# Patient Record
Sex: Male | Born: 1945 | Race: White | Hispanic: No | Marital: Married | Smoking: Never smoker
Health system: Southern US, Community
[De-identification: ages and names within clinical notes are randomized; demographics above are authoritative.]

## PROBLEM LIST (undated history)

## (undated) DIAGNOSIS — I10 Essential (primary) hypertension: Secondary | ICD-10-CM

## (undated) DIAGNOSIS — E7800 Pure hypercholesterolemia, unspecified: Secondary | ICD-10-CM

---

## 2013-09-16 ENCOUNTER — Ambulatory Visit: Payer: Self-pay | Admitting: Physician Assistant

## 2013-09-16 LAB — BASIC METABOLIC PANEL
Anion Gap: 10 (ref 7–16)
BUN: 19 mg/dL — ABNORMAL HIGH (ref 7–18)
CALCIUM: 10 mg/dL (ref 8.5–10.1)
CO2: 27 mmol/L (ref 21–32)
CREATININE: 1.29 mg/dL (ref 0.60–1.30)
Chloride: 104 mmol/L (ref 98–107)
EGFR (African American): >60
EGFR (Non-African Amer.): 57 — ABNORMAL LOW
GLUCOSE: 101 mg/dL — AB (ref 65–99)
Osmolality: 284 (ref 275–301)
Potassium: 4.5 mmol/L (ref 3.5–5.1)
SODIUM: 141 mmol/L (ref 136–145)

## 2013-09-16 LAB — CBC WITH DIFFERENTIAL/PLATELET
Basophil #: 0.1 10*3/uL (ref 0.0–0.1)
Basophil %: 0.7 %
Eosinophil #: 0.1 10*3/uL (ref 0.0–0.7)
Eosinophil %: 0.9 %
HCT: 44 % (ref 40.0–52.0)
HGB: 14.8 g/dL (ref 13.0–18.0)
LYMPHS PCT: 15.6 %
Lymphocyte #: 2.1 10*3/uL (ref 1.0–3.6)
MCH: 30.1 pg (ref 26.0–34.0)
MCHC: 33.6 g/dL (ref 32.0–36.0)
MCV: 90 fL (ref 80–100)
MONOS PCT: 13.8 %
Monocyte #: 1.9 x10 3/mm — ABNORMAL HIGH (ref 0.2–1.0)
NEUTROS ABS: 9.3 10*3/uL — AB (ref 1.4–6.5)
Neutrophil %: 69 %
PLATELETS: 236 10*3/uL (ref 150–440)
RBC: 4.91 10*6/uL (ref 4.40–5.90)
RDW: 14 % (ref 11.5–14.5)
WBC: 13.5 10*3/uL — AB (ref 3.8–10.6)

## 2013-09-16 LAB — URINALYSIS, COMPLETE
BACTERIA: NEGATIVE
Bilirubin,UR: NEGATIVE
Blood: NEGATIVE
Glucose,UR: NEGATIVE mg/dL (ref 0–75)
KETONE: NEGATIVE
Leukocyte Esterase: NEGATIVE
Nitrite: NEGATIVE
Ph: 5 (ref 4.5–8.0)
Protein: NEGATIVE
Specific Gravity: 1.02 (ref 1.003–1.030)
Squamous Epithelial: NONE SEEN

## 2014-12-18 ENCOUNTER — Other Ambulatory Visit: Payer: Self-pay | Admitting: Otolaryngology

## 2014-12-18 DIAGNOSIS — H903 Sensorineural hearing loss, bilateral: Secondary | ICD-10-CM

## 2014-12-18 DIAGNOSIS — H905 Unspecified sensorineural hearing loss: Secondary | ICD-10-CM

## 2014-12-26 ENCOUNTER — Ambulatory Visit
Admission: RE | Admit: 2014-12-26 | Discharge: 2014-12-26 | Disposition: A | Discharge status: Home / Self Care | Payer: Medicare Other | Source: Ambulatory Visit | Attending: Otolaryngology | Admitting: Otolaryngology

## 2014-12-26 DIAGNOSIS — H903 Sensorineural hearing loss, bilateral: Secondary | ICD-10-CM

## 2014-12-26 DIAGNOSIS — H905 Unspecified sensorineural hearing loss: Secondary | ICD-10-CM | POA: Diagnosis present

## 2014-12-26 LAB — CREATININE, SERUM
CREATININE: 0.96 mg/dL (ref 0.61–1.24)
GFR calc Af Amer: >60 mL/min (ref >60)
GFR calc non Af Amer: >60 mL/min (ref >60)

## 2014-12-26 MED ORDER — GADOBENATE DIMEGLUMINE 529 MG/ML IV SOLN
20.0000 mL | Freq: Once | INTRAVENOUS | Status: AC | PRN
  Administered 2014-12-26: 18 mL via INTRAVENOUS

## 2018-04-02 ENCOUNTER — Ambulatory Visit
Admission: EM | Admit: 2018-04-02 | Discharge: 2018-04-02 | Disposition: A | Discharge status: Home / Self Care | Payer: Medicare Other | Attending: Family Medicine | Admitting: Family Medicine

## 2018-04-02 ENCOUNTER — Encounter: Payer: Self-pay | Admitting: Emergency Medicine

## 2018-04-02 ENCOUNTER — Other Ambulatory Visit: Payer: Self-pay

## 2018-04-02 DIAGNOSIS — R51 Headache: Secondary | ICD-10-CM | POA: Diagnosis not present

## 2018-04-02 DIAGNOSIS — M25511 Pain in right shoulder: Secondary | ICD-10-CM | POA: Diagnosis not present

## 2018-04-02 DIAGNOSIS — M25561 Pain in right knee: Secondary | ICD-10-CM

## 2018-04-02 HISTORY — DX: Essential (primary) hypertension: I10

## 2018-04-02 HISTORY — DX: Pure hypercholesterolemia, unspecified: E78.00

## 2018-04-02 MED ORDER — NAPROXEN 500 MG PO TABS: 500.0000 mg | ORAL_TABLET | Freq: Two times a day (BID) | ORAL | 0 refills | Status: AC | PRN
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
## 2018-04-02 NOTE — Discharge Instructions (Signed)
Rest.  Heat and Ice.  Use the medication as needed for pain.  Take care  Dr. Adriana Simasook
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
## 2018-04-13 ENCOUNTER — Other Ambulatory Visit: Payer: Self-pay | Admitting: Family Medicine

## 2021-01-06 ENCOUNTER — Emergency Department: Payer: Medicare Other

## 2021-01-06 ENCOUNTER — Other Ambulatory Visit: Payer: Self-pay

## 2021-01-06 ENCOUNTER — Encounter: Payer: Self-pay | Admitting: Emergency Medicine

## 2021-01-06 ENCOUNTER — Ambulatory Visit
Admission: EM | Admit: 2021-01-06 | Discharge: 2021-01-06 | Disposition: A | Discharge status: Home / Self Care | Payer: Medicare Other | Attending: Sports Medicine | Admitting: Sports Medicine

## 2021-01-06 ENCOUNTER — Emergency Department
Admission: EM | Admit: 2021-01-06 | Discharge: 2021-01-06 | Disposition: A | Discharge status: Home / Self Care | Payer: Medicare Other | Attending: Emergency Medicine | Admitting: Emergency Medicine

## 2021-01-06 DIAGNOSIS — N200 Calculus of kidney: Secondary | ICD-10-CM | POA: Insufficient documentation

## 2021-01-06 DIAGNOSIS — K449 Diaphragmatic hernia without obstruction or gangrene: Secondary | ICD-10-CM | POA: Diagnosis not present

## 2021-01-06 DIAGNOSIS — Z79899 Other long term (current) drug therapy: Secondary | ICD-10-CM | POA: Diagnosis not present

## 2021-01-06 DIAGNOSIS — I700 Atherosclerosis of aorta: Secondary | ICD-10-CM | POA: Insufficient documentation

## 2021-01-06 DIAGNOSIS — K4090 Unilateral inguinal hernia, without obstruction or gangrene, not specified as recurrent: Secondary | ICD-10-CM | POA: Diagnosis not present

## 2021-01-06 DIAGNOSIS — K5790 Diverticulosis of intestine, part unspecified, without perforation or abscess without bleeding: Secondary | ICD-10-CM | POA: Diagnosis not present

## 2021-01-06 DIAGNOSIS — I10 Essential (primary) hypertension: Secondary | ICD-10-CM | POA: Diagnosis not present

## 2021-01-06 DIAGNOSIS — R1909 Other intra-abdominal and pelvic swelling, mass and lump: Secondary | ICD-10-CM

## 2021-01-06 DIAGNOSIS — R1032 Left lower quadrant pain: Secondary | ICD-10-CM

## 2021-01-06 NOTE — ED Provider Notes (Signed)
[facility] Emergency Department Provider Note  ____________________________________________   Event Date/Time   First MD Initiated Contact with Patient 01/06/21 1545     (approximate)  I have reviewed the triage vital signs and the nursing notes.   HISTORY  Chief Complaint Groin Pain   HPI Justin Swanson is a 75 y.o. male with a past medical history of HTN and HDL who presents to the ED for assessment of painful bulging mass in his left groin Referred from urgent care.  Seems this has been on and off for the last couple weeks but patient states that over the last day or so it does not come back in again and has been more painful today..  No urinary symptoms, recent falls or injuries, vomiting, diarrhea, dysuria, abdominal pain, back pain or any right-sided symptoms.  No prior similar episodes.  No clear alleviating or aggravating factors.  No other acute concerns at this time.  States he thinks his last Bowel movement was about a day and half ago but has been passing gas.         Past Medical History:  Diagnosis Date  . Hypercholesteremia   . Hypertension     There are no problems to display for this patient.   History reviewed. No pertinent surgical history.  Prior to Admission medications   Medication Sig Start Date End Date Taking? Authorizing Provider  atorvastatin (LIPITOR) 80 MG tablet Take by mouth. 12/02/17 01/06/21  [provider]  esomeprazole (NEXIUM) 20 MG capsule Take by mouth.    [provider]  lisinopril (PRINIVIL,ZESTRIL) 40 MG tablet  03/31/18   [provider]  naproxen (NAPROSYN) 500 MG tablet Take 1 tablet (500 mg total) by mouth 2 (two) times daily as needed. 04/02/18   Tommie Sams, DO    Allergies Patient has no known allergies.  No family history on file.  Social History Social History   Tobacco Use  . Smoking status: Never Smoker  . Smokeless tobacco: Never Used  Vaping Use  .  Vaping Use: Never used  Substance Use Topics  . Alcohol use: Never  . Drug use: Never    Review of Systems  Review of Systems  Constitutional: Negative for chills and fever.  HENT: Negative for sore throat.   Eyes: Negative for pain.  Respiratory: Negative for cough and stridor.   Cardiovascular: Negative for chest pain.  Gastrointestinal: Positive for abdominal pain. Negative for vomiting.  Genitourinary: Negative for dysuria.  Musculoskeletal: Negative for myalgias.  Skin: Negative for rash.  Neurological: Negative for seizures, loss of consciousness and headaches.  Psychiatric/Behavioral: Negative for suicidal ideas.  All other systems reviewed and are negative.     ____________________________________________   PHYSICAL EXAM:  VITAL SIGNS: ED Triage Vitals  Enc Vitals Group     BP      Pulse      Resp      Temp      Temp src      SpO2      Weight      Height      Head Circumference      Peak Flow      Pain Score      Pain Loc      Pain Edu?      Excl. in GC?    Vitals:   01/06/21 1548  BP: 120/78  Pulse: 89  Resp: 16  Temp: (!) 97.4 F (36.3 C)  SpO2: 98%  Physical Exam Vitals and nursing note reviewed. Exam conducted with a chaperone present.  Constitutional:      Appearance: He is well-developed.  HENT:     Head: Normocephalic and atraumatic.     Right Ear: External ear normal.     Left Ear: External ear normal.     Nose: Nose normal.  Eyes:     Conjunctiva/sclera: Conjunctivae normal.  Cardiovascular:     Rate and Rhythm: Normal rate and regular rhythm.     Heart sounds: No murmur heard.   Pulmonary:     Effort: Pulmonary effort is normal. No respiratory distress.     Breath sounds: Normal breath sounds.  Abdominal:     Palpations: Abdomen is soft.     Tenderness: There is no abdominal tenderness.     Hernia: A hernia is present. Hernia is present in the left inguinal area.  Musculoskeletal:     Cervical back: Neck supple.   Skin:    General: Skin is warm and dry.     Capillary Refill: Capillary refill takes less than 2 seconds.  Neurological:     Mental Status: He is alert and oriented to person, place, and time.  Psychiatric:        Mood and Affect: Mood normal.      ____________________________________________   LABS (all labs ordered are listed, but only abnormal results are displayed)  Labs Reviewed - No data to display ____________________________________________  EKG  ____________________________________________  RADIOLOGY  ED MD interpretation: CT A/P w/o evidence of hernia, SBO or other clear acute abdominopelvic process.  There is evidence of diverticulosis without diverticulitis, aortic atherosclerosis and small hiatal hernia as well as a nonobstructing stone in the left kidney 3 mm without any ureteral stones.  No other clear abdominal pelvic process.  Official radiology report(s): CT ABDOMEN PELVIS WO CONTRAST  Result Date: 01/06/2021 CLINICAL DATA:  75 year old male with history of abdominal pain. Suspected hernia. EXAM: CT ABDOMEN AND PELVIS WITHOUT CONTRAST TECHNIQUE: Multidetector CT imaging of the abdomen and pelvis was performed following the standard protocol without IV contrast. COMPARISON:  No priors. FINDINGS: Lower chest: Atherosclerotic calcifications in the descending thoracic aorta as well as the right coronary artery. Small hiatal hernia. Hepatobiliary: No suspicious cystic or solid hepatic lesions are confidently identified on today's noncontrast CT examination. Unenhanced appearance of the gallbladder is unremarkable. Pancreas: No definite pancreatic mass or peripancreatic fluid collections or inflammatory changes are noted on today's noncontrast CT examination. Spleen: Unremarkable. Adrenals/Urinary Tract: Nonobstructive calculi in the left renal collecting system measuring up to 3 mm in the upper pole the left kidney. No additional calculi are noted within the right renal  collecting system, along the course of either ureter, or within the lumen of the urinary bladder. No hydroureteronephrosis. Low-attenuation lesions in both kidneys, incompletely characterized on today's non-contrast CT examination, but statistically likely to represent cysts, largest of which measures 4.6 cm in the upper pole of the right kidney. Unenhanced appearance of the urinary bladder is unremarkable. Bilateral adrenal glands are normal in appearance. Stomach/Bowel: Unenhanced appearance of the stomach is normal. No pathologic dilatation of small bowel or colon. Several colonic diverticulae are noted, without surrounding inflammatory changes to suggest an acute diverticulitis at this time. Normal appendix. Vascular/Lymphatic: Aortic atherosclerosis. No lymphadenopathy noted in the abdomen or pelvis. Reproductive: Prostate gland and seminal vesicles are unremarkable in appearance. Other: No definite abdominal wall hernia noted. No significant volume of ascites. No pneumoperitoneum. Musculoskeletal: There are no aggressive appearing lytic  or blastic lesions noted in the visualized portions of the skeleton. IMPRESSION: 1. No abdominal wall hernia identified. 2. Nonobstructive calculi in the collecting system of left kidney measuring up to 3 mm. No ureteral stones or findings of urinary tract obstruction are noted at this time. 3. Colonic diverticulosis without evidence of acute diverticulitis at this time. 4. Aortic atherosclerosis, as well as right coronary artery disease. 5. Small hiatal hernia. Electronically Signed   By: Trudie Reed M.D.   On: 01/06/2021 16:27    ____________________________________________   PROCEDURES  Procedure(s) performed (including Critical Care):  Hernia reduction  Date/Time: 01/06/2021 3:58 PM Performed by: Gilles Chiquito, MD Authorized by: Gilles Chiquito, MD  Consent: Verbal consent obtained. Consent given by: patient Patient understanding: patient states  understanding of the procedure being performed Patient identity confirmed: verbally with patient Local anesthesia used: no  Anesthesia: Local anesthesia used: no  Sedation: Patient sedated: no  Patient tolerance: patient tolerated the procedure well with no immediate complications      ____________________________________________   INITIAL IMPRESSION / ASSESSMENT AND PLAN / ED COURSE      Patient presents with above-stated history and exam after being referred from urgent care with concern for hernia in the left groin.  On arrival he is afebrile and hemodynamically stable.  He does have palpable left inguinal hernia.  Scrotal testicular and remainder abdominal exam is unremarkable.  No overlying skin changes or significant tenderness.  Currently easily reduced after placing patient in Trendelenburg.  CT A/P w/o evidence of hernia, SBO or other clear acute abdominopelvic process.  There is evidence of diverticulosis without diverticulitis, aortic atherosclerosis and small hiatal hernia as well as a nonobstructing stone in the left kidney 3 mm without any ureteral stones.  No other clear abdominal pelvic process.  Given hernias easily reducible without any overlying skin changes and patient stated after he went back and he has 0 abdominal pain and otherwise has a soft nontender abdomen with reassuring CT abdomen very low suspicion for ischemia or any retained incarceration.  I think patient is stable for discharge with plan for close outpatient surgery follow-up for evaluation of repair of symptomatic intermittent but reducible inguinal hernia.  Advised patient to start bowel regiment to avoid constipation and increased intra-abdominal pressures.  He is amenable to this.  Advised him of incidental findings on CT including kidney stone atherosclerosis hiatal hernia and diverticulosis.  Patient discharged stable condition.  Strict term cautions advised and discussed.       ____________________________________________   FINAL CLINICAL IMPRESSION(S) / ED DIAGNOSES  Final diagnoses:  Unilateral inguinal hernia without obstruction or gangrene, recurrence not specified  Aortic atherosclerosis (HCC)  Kidney stone  Hiatal hernia  Diverticulosis    Medications - No data to display   ED Discharge Orders    None       Note:  This document was prepared using Dragon voice recognition software and may include unintentional dictation errors.   Gilles Chiquito, MD 01/06/21 [phone]

## 2021-01-06 NOTE — ED Triage Notes (Signed)
Patient reports tender lump on the left side of his groin/ pelvis area that started 2-3 days.  Patient denies pain or swelling in his penis or testicles.

## 2021-01-06 NOTE — ED Provider Notes (Signed)
[facility]    CSN: [id number] Arrival date & time: 01/06/21  1417      History   Chief Complaint Chief Complaint  Patient presents with  . Groin Swelling    Left side    HPI Justin Swanson is a 75 y.o. male.   Patient is a very pleasant 75 year old male who presents for evaluation of the above issue.  Normally sees [facility].  On further history it appears as though he has an appointment on June 7 to do with this.  He says that he has been having a feeling of heaviness for about 10 days.  He gets pain and swelling on the left side of his groin.  He says it comes and goes.  Today the pain was quite bad.  He works part-time over at [employer] course and he was unable to even get out of the car today.  He notes a lot of swelling of visible mass and a lot of tenderness.  No fever shakes chills.  There is not visible bulge.  No real changes with bowel movements.  He does not recall any injury.  No real abdominal pain.  No nausea vomiting or diarrhea.  No flank pain or urinary symptoms.  He denies any testicular or scrotal or penis pain.  No discharge.  All the pain and swelling is localized on the left side.         Past Medical History:  Diagnosis Date  . Hypercholesteremia   . Hypertension     There are no problems to display for this patient.   History reviewed. No pertinent surgical history.     Home Medications    Prior to Admission medications   Medication Sig Start Date End Date Taking? Authorizing Provider  atorvastatin (LIPITOR) 80 MG tablet Take by mouth. 12/02/17 01/06/21 Yes [provider]  lisinopril (PRINIVIL,ZESTRIL) 40 MG tablet  03/31/18  Yes [provider]  esomeprazole (NEXIUM) 20 MG capsule Take by mouth.    [provider]  naproxen (NAPROSYN) 500 MG tablet Take 1 tablet (500 mg total) by mouth 2 (two) times daily as needed. 04/02/18   Tommie Sams, DO    Family History History reviewed. No  pertinent family history.  Social History Social History   Tobacco Use  . Smoking status: Never Smoker  . Smokeless tobacco: Never Used  Vaping Use  . Vaping Use: Never used  Substance Use Topics  . Alcohol use: Never  . Drug use: Never     Allergies   Patient has no known allergies.   Review of Systems Review of Systems  Constitutional: Positive for activity change. Negative for appetite change, chills, diaphoresis, fatigue and fever.  HENT: Negative for congestion, ear pain, postnasal drip, rhinorrhea, sinus pressure, sinus pain, sneezing and sore throat.   Eyes: Negative for pain.  Respiratory: Negative for cough, chest tightness and shortness of breath.   Cardiovascular: Negative for chest pain and palpitations.  Gastrointestinal: Negative for abdominal pain, diarrhea, nausea and vomiting.  Genitourinary: Negative for dysuria, flank pain, frequency, penile discharge, penile swelling, scrotal swelling, testicular pain and urgency.       Painful visible bulge on the left side of the groin area.  Musculoskeletal: Negative for back pain, myalgias and neck pain.  Skin: Negative for color change, pallor, rash and wound.  Neurological: Negative for dizziness, light-headedness and headaches.  All other systems reviewed and are negative.    Physical Exam Triage  Vital Signs ED Triage Vitals  Enc Vitals Group     BP 01/06/21 1431 110/76     Pulse Rate 01/06/21 1431 90     Resp 01/06/21 1431 16     Temp 01/06/21 1431 98.1 F (36.7 C)     Temp Source 01/06/21 1431 Oral     SpO2 01/06/21 1431 98 %     Weight 01/06/21 1429 191 lb (86.6 kg)     Height 01/06/21 1429 5\' 8"  (1.727 m)     Head Circumference --      Peak Flow --      Pain Score 01/06/21 1429 3     Pain Loc --      Pain Edu? --      Excl. in GC? --    No data found.  Updated Vital Signs BP 110/76 (BP Location: Left Arm)   Pulse 90   Temp 98.1 F (36.7 C) (Oral)   Resp 16   Ht 5\' 8"  (1.727 m)   Wt 86.6  kg   SpO2 98%   BMI 29.04 kg/m   Visual Acuity Right Eye Distance:   Left Eye Distance:   Bilateral Distance:    Right Eye Near:   Left Eye Near:    Bilateral Near:     Physical Exam Vitals and nursing note reviewed.  Constitutional:      Appearance: Normal appearance.  HENT:     Head: Normocephalic and atraumatic.     Nose: Nose normal.     Mouth/Throat:     Mouth: Mucous membranes are moist.  Eyes:     Conjunctiva/sclera: Conjunctivae normal.     Pupils: Pupils are equal, round, and reactive to light.  Cardiovascular:     Rate and Rhythm: Normal rate and regular rhythm.     Pulses: Normal pulses.     Heart sounds: Normal heart sounds. No murmur heard. No friction rub. No gallop.   Pulmonary:     Effort: Pulmonary effort is normal.     Breath sounds: Normal breath sounds. No stridor. No wheezing, rhonchi or rales.  Abdominal:     General: Abdomen is flat.     Palpations: Abdomen is soft.     Tenderness: There is no abdominal tenderness. There is no right CVA tenderness, left CVA tenderness, guarding or rebound.     Hernia: A hernia is present. Hernia is present in the left inguinal area. There is no hernia in the right inguinal area.  Genitourinary:    Penis: Normal.      Testes: Normal.        Right: Mass, tenderness or swelling not present.        Left: Mass, tenderness or swelling not present.     Comments: Patient has a hard indurated visible mass on the left side of the groin concerning for a large hernia that may be incarcerated or in the early stages. Musculoskeletal:     Cervical back: Normal range of motion and neck supple.  Skin:    General: Skin is warm and dry.     Capillary Refill: Capillary refill takes less than 2 seconds.     Findings: No bruising, erythema or lesion.  Neurological:     General: No focal deficit present.     Mental Status: He is alert and oriented to person, place, and time.  Psychiatric:        Mood and Affect: Mood normal.       UC Treatments / Results  Labs (all labs ordered are listed, but only abnormal results are displayed) Labs Reviewed - No data to display  EKG   Radiology No results found.  Procedures Procedures (including critical care time)  Medications Ordered in UC Medications - No data to display  Initial Impression / Assessment and Plan / UC Course  I have reviewed the triage vital signs and the nursing notes.  Pertinent labs & imaging results that were available during my care of the patient were reviewed by me and considered in my medical decision making (see chart for details).  Clinical impression: Left-sided groin pain with a visible mass concerning for a potential incarcerated direct hernia.  Treatment plan: 1.  The findings and treatment plan were discussed in detail with the patient and his wife.  All parties were in agreement voiced verbal understanding. 2.  I do not have access to advanced imaging.  I have recommended he go directly to [facility] emergency room for a higher level of care, imaging, and potential surgical intervention. 3.  He is stable for his wife to take him there and he will go by his own car and not by ambulance. 4.  I called and expecting to the charge nurse Vernona Rieger. 5.  He will go directly [facility] ER and he was discharged in stable condition.     Final Clinical Impressions(s) / UC Diagnoses   Final diagnoses:  Severe left groin pain  Groin swelling  Direct inguinal hernia of left side     Discharge Instructions     As we discussed, you have a fairly large hernia.  You have a visible mass and a lot of pain.  My concern is that this is incarcerated and strangulated.  You need a higher level of care and I have advised you to go directly to [facility] emergency room.  You told me that you and your wife will go directly there.  I called in an expect to the charge nurse in the emergency room and they are expecting you.    ED Prescriptions    None     PDMP  not reviewed this encounter.   Delton See, MD 01/06/21 [phone]

## 2021-01-06 NOTE — ED Triage Notes (Addendum)
Pt to ER from [facility] for possible CT scan, diagnosed with hernia. Pt reports lower left groin swelling x10 days, reports area feels like a balloon with visible mass. Reports mass goes "in and out", pain present when area protrudes. Unsure how long area has been swollen. No urinary symptoms. Area producible. Last BM this morning. Reports active lifestyle.   MD Katrinka Blazing at bedside.

## 2021-01-06 NOTE — Discharge Instructions (Addendum)
As we discussed, you have a fairly large hernia.  You have a visible mass and a lot of pain.  My concern is that this is incarcerated and strangulated.  You need a higher level of care and I have advised you to go directly to [facility] emergency room.  You told me that you and your wife will go directly there.  I called in an expect to the charge nurse in the emergency room and they are expecting you.

## 2022-12-31 IMAGING — CT CT ABD-PELV W/O CM
2 of 4 series · 15 of 46 positions shown, 17 images · non-contrast
Comparison: No priors.

CLINICAL DATA: 75-year-old male with history of abdominal pain.
Suspected hernia.

EXAM:
CT ABDOMEN AND PELVIS WITHOUT CONTRAST
TECHNIQUE: Multidetector CT imaging of the abdomen and pelvis was performed
following the standard protocol without IV contrast.

[Series 2: routine abd/pel wo · axial · 0.84mm/px · z∈[-768,-358]mm · 12 of 98 slices shown, 14 images]
[im 8/98  soft-tissue]
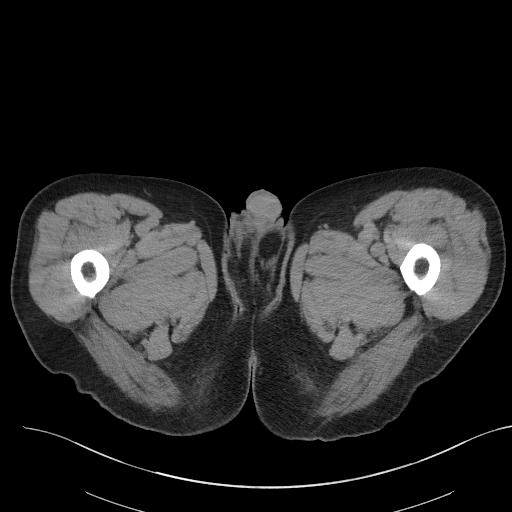
[im 8/98  bone]
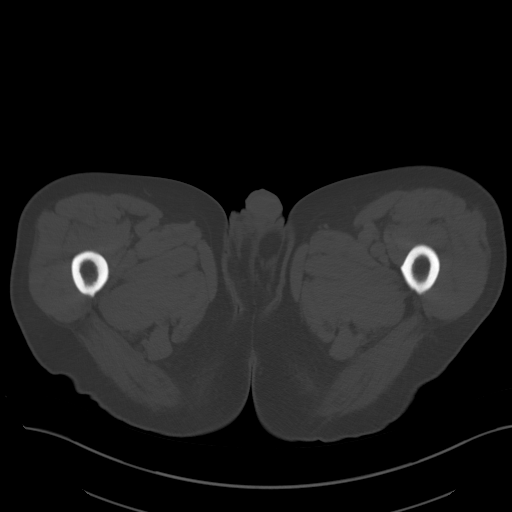
[im 15/98  soft-tissue]
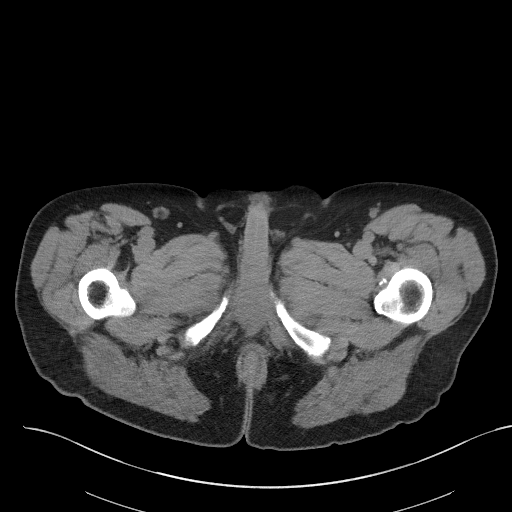
[im 23/98  soft-tissue]
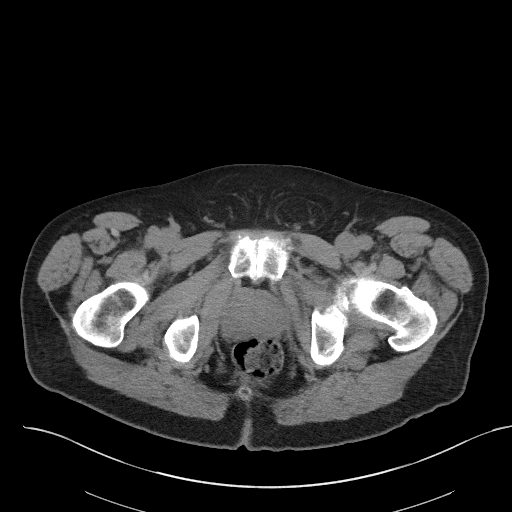
[im 30/98  soft-tissue]
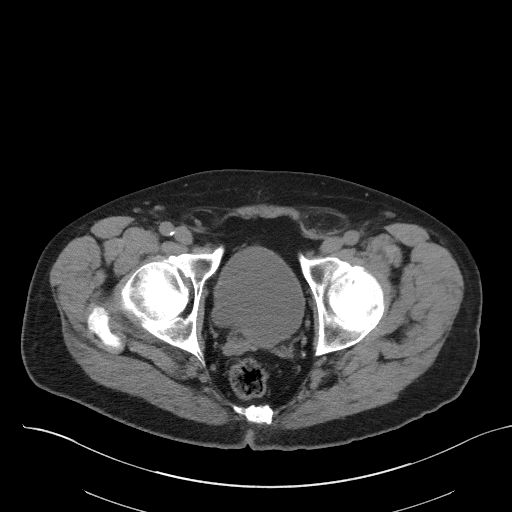
[im 38/98  soft-tissue]
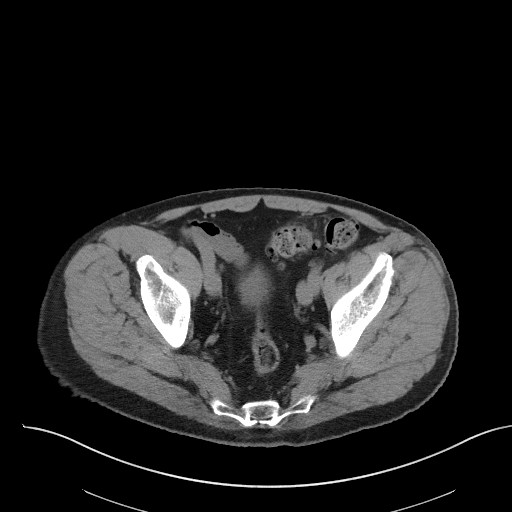
[im 45/98  soft-tissue]
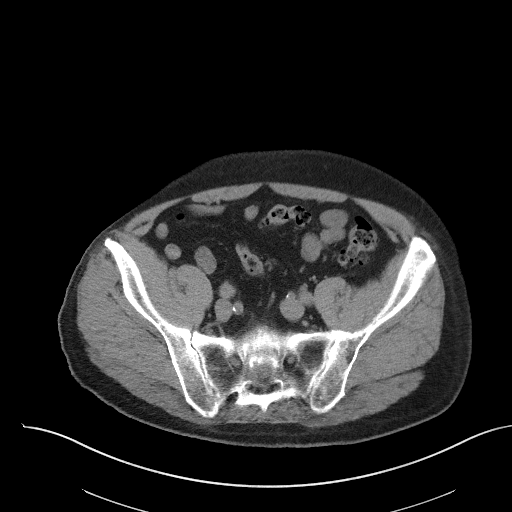
[im 53/98  soft-tissue]
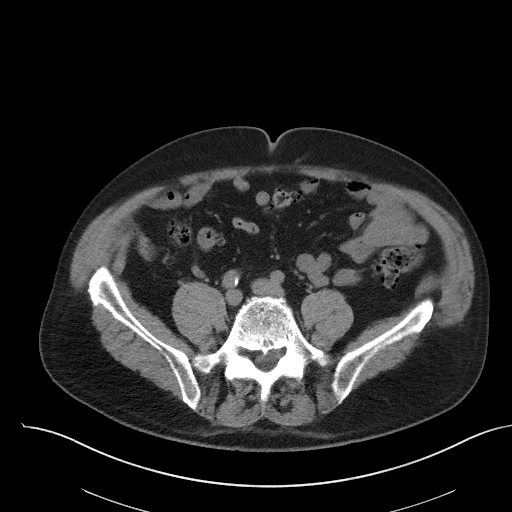
[im 60/98  soft-tissue]
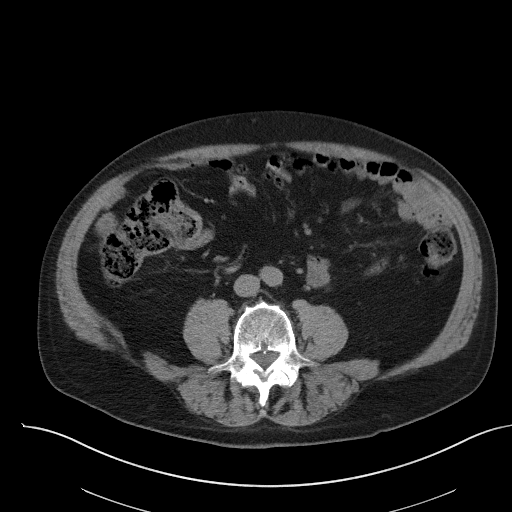
[im 68/98  soft-tissue]
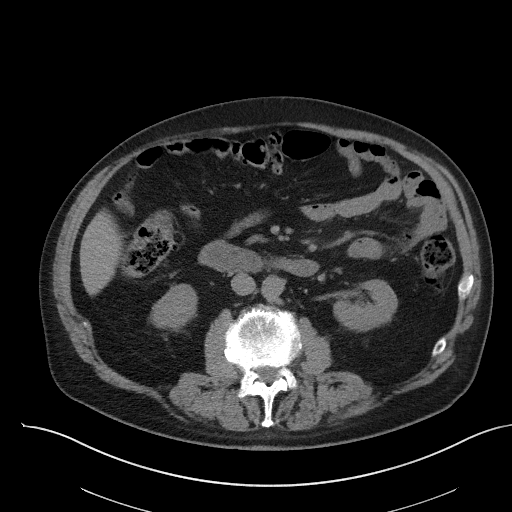
[im 68/98  bone]
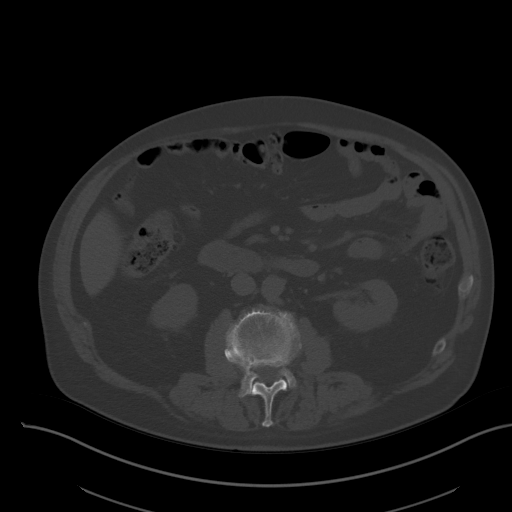
[im 75/98  soft-tissue]
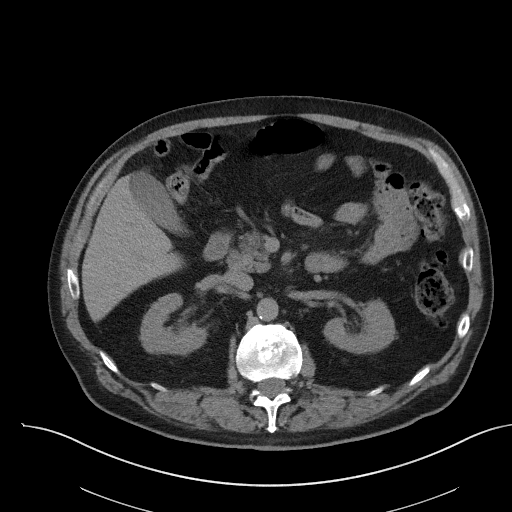
[im 83/98  soft-tissue]
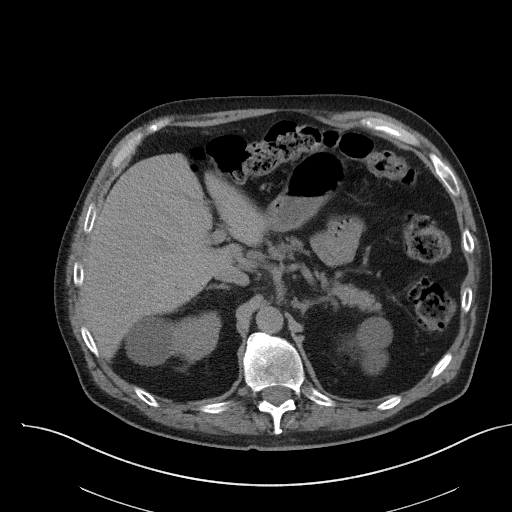
[im 90/98  soft-tissue]
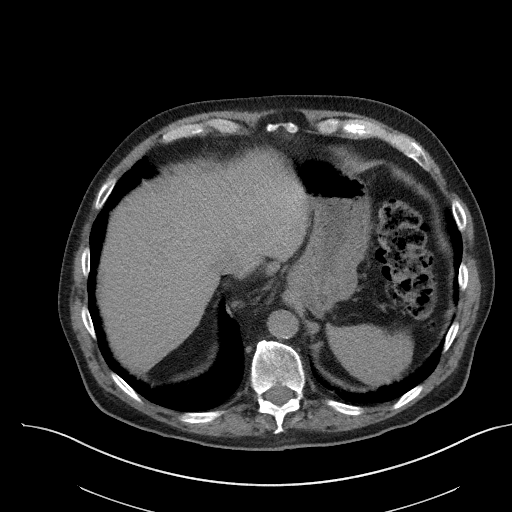

[Series 5: coronal st · coronal · 0.81mm/px · 3 of 101 slices shown]
[im 34/101  soft-tissue]
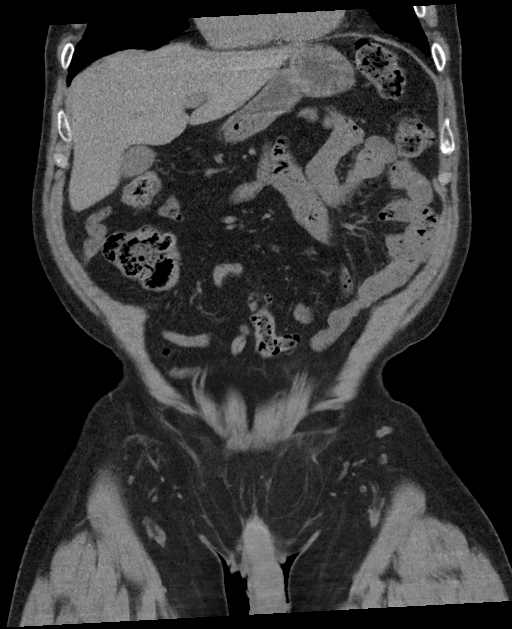
[im 45/101  soft-tissue]
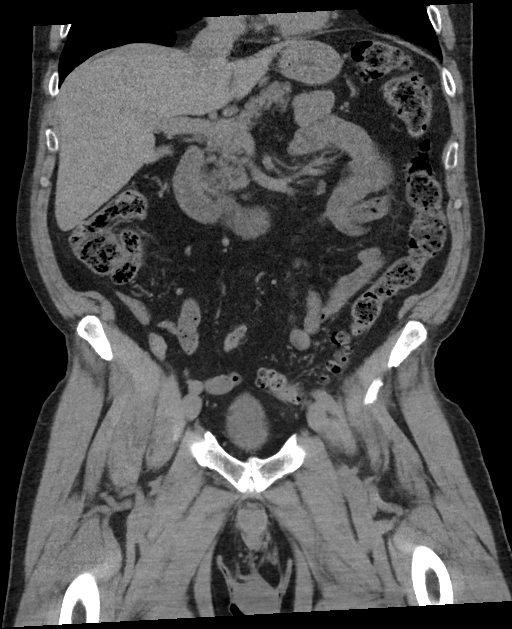
[im 56/101  soft-tissue]
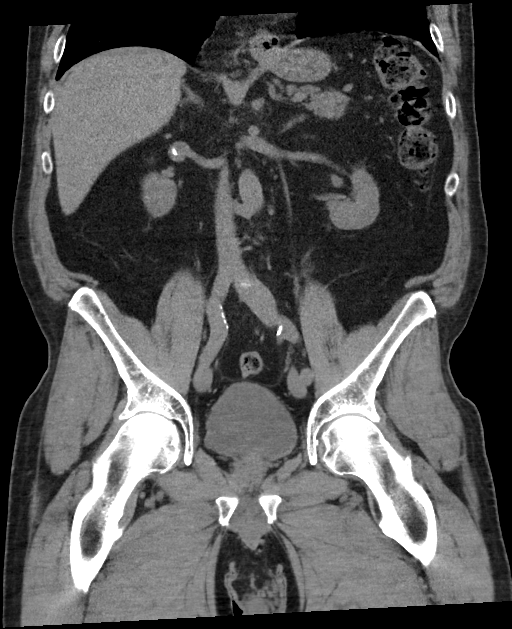

[15 of 46 positions shown; findings below may reference images not displayed]

FINDINGS: Lower chest: Atherosclerotic calcifications in the descending
thoracic aorta as well as the right coronary artery. Small hiatal
hernia.

Hepatobiliary: No suspicious cystic or solid hepatic lesions are
confidently identified on today's noncontrast CT examination.
Unenhanced appearance of the gallbladder is unremarkable.

Pancreas: No definite pancreatic mass or peripancreatic fluid
collections or inflammatory changes are noted on today's noncontrast
CT examination.

Spleen: Unremarkable.

Adrenals/Urinary Tract: Nonobstructive calculi in the left renal
collecting system measuring up to 3 mm in the upper pole the left
kidney. No additional calculi are noted within the right renal
collecting system, along the course of either ureter, or within the
lumen of the urinary bladder. No hydroureteronephrosis.
Low-attenuation lesions in both kidneys, incompletely characterized
on today's non-contrast CT examination, but statistically likely to
represent cysts, largest of which measures 4.6 cm in the upper pole
of the right kidney. Unenhanced appearance of the urinary bladder is
unremarkable. Bilateral adrenal glands are normal in appearance.

Stomach/Bowel: Unenhanced appearance of the stomach is normal. No
pathologic dilatation of small bowel or colon. Several colonic
diverticulae are noted, without surrounding inflammatory changes to
suggest an acute diverticulitis at this time. Normal appendix.

Vascular/Lymphatic: Aortic atherosclerosis. No lymphadenopathy noted
in the abdomen or pelvis.

Reproductive: Prostate gland and seminal vesicles are unremarkable
in appearance.

Other: No definite abdominal wall hernia noted. No significant
volume of ascites. No pneumoperitoneum.

Musculoskeletal: There are no aggressive appearing lytic or blastic
lesions noted in the visualized portions of the skeleton.
IMPRESSION: 1. No abdominal wall hernia identified.
2. Nonobstructive calculi in the collecting system of left kidney
measuring up to 3 mm. No ureteral stones or findings of urinary
tract obstruction are noted at this time.
3. Colonic diverticulosis without evidence of acute diverticulitis
at this time.
4. Aortic atherosclerosis, as well as right coronary artery disease.
5. Small hiatal hernia.
